# Patient Record
Sex: Male | Born: 1955 | Race: White | Marital: Single
Health system: Southern US, Community
[De-identification: ages and names within clinical notes are randomized; demographics above are authoritative.]

---

## 2011-12-11 ENCOUNTER — Other Ambulatory Visit: Payer: Self-pay | Admitting: Neurosurgery

## 2011-12-11 DIAGNOSIS — M48061 Spinal stenosis, lumbar region without neurogenic claudication: Secondary | ICD-10-CM

## 2011-12-26 ENCOUNTER — Ambulatory Visit
Admission: RE | Admit: 2011-12-26 | Discharge: 2011-12-26 | Disposition: A | Discharge status: Home / Self Care | Payer: BC Managed Care – PPO | Source: Ambulatory Visit | Attending: Neurosurgery | Admitting: Neurosurgery

## 2011-12-26 ENCOUNTER — Ambulatory Visit
Admission: RE | Admit: 2011-12-26 | Discharge: 2011-12-26 | Disposition: A | Discharge status: Home / Self Care | Payer: Medicare PPO | Source: Ambulatory Visit | Attending: Neurosurgery | Admitting: Neurosurgery

## 2011-12-26 VITALS — BP 141/79 | HR 69

## 2011-12-26 DIAGNOSIS — M48061 Spinal stenosis, lumbar region without neurogenic claudication: Secondary | ICD-10-CM

## 2011-12-26 MED ORDER — OXYCODONE-ACETAMINOPHEN 5-325 MG PO TABS
2.0000 | ORAL_TABLET | Freq: Once | ORAL | Status: AC
  Administered 2011-12-26: 2 via ORAL

## 2011-12-26 MED ORDER — IOHEXOL 180 MG/ML  SOLN
15.0000 mL | Freq: Once | INTRAMUSCULAR | Status: AC | PRN
  Administered 2011-12-26: 15 mL via INTRATHECAL

## 2011-12-26 MED ORDER — DIAZEPAM 5 MG PO TABS
10.0000 mg | ORAL_TABLET | Freq: Once | ORAL | Status: AC
  Administered 2011-12-26: 10 mg via ORAL

## 2011-12-26 NOTE — Progress Notes (Signed)
Patient states he has been off Trazadone for the past two days.  Larina Earthly, RN

## 2013-04-26 IMAGING — CT CT L SPINE W/ CM
3 of 9 series · 10 of 27 positions shown, 11 images · IV contrast (omnipaque)
Comparison: Lumbar MRI 10/20/2011 at [facility].

CLINICAL DATA: Low back pain extending into the right lower
extremity.  Pain extends along the posterior right thigh to the
knee.

MYELOGRAM INJECTION
TECHNIQUE: Informed consent was obtained from the patient prior to
the procedure, including potential complications of headache,
allergy, infection and pain.  A timeout procedure was performed.
With the patient prone, the lower back was prepped with Betadine.
1% Lidocaine was used for local anesthesia.  Lumbar puncture was
performed at the left paramidline L1-2 level using a 22 gauge
needle with return of clear CSF.  15 ml of Omnipaque 775was
injected into the subarachnoid space .
TECHNIQUE: I personally performed the lumbar puncture and
administered the intrathecal contrast. I also personally supervised
acquisition of the myelogram images. Following injection of
intrathecal Omnipaque contrast, spine imaging in multiple
projections was performed using fluoroscopy.
Fluoroscopy Time: 1.2 minutes.
TECHNIQUE: CT imaging of the lumbar spine was performed after
intrathecal contrast administration.  Multiplanar CT image
reconstructions were also generated.

[Series 2: l spine bone · axial · 0.27mm/px · z∈[-65,+3]mm · 2 of 82 slices shown, 3 images]
[im 28/82  soft-tissue]
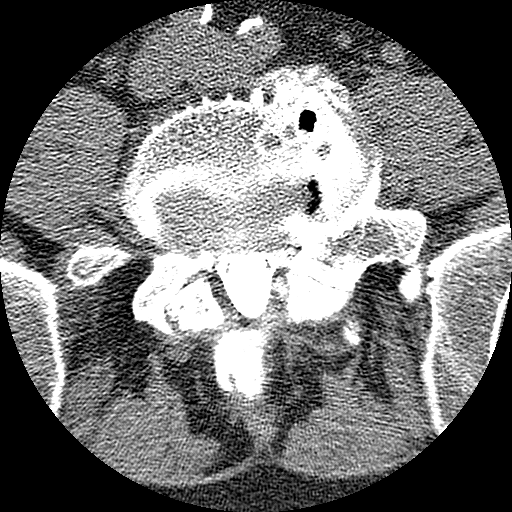
[im 28/82  bone]
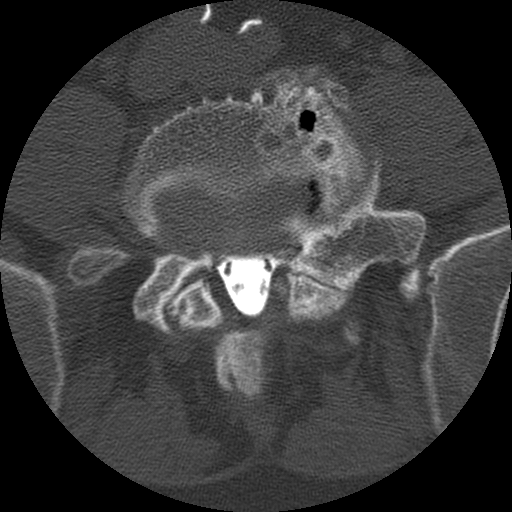
[im 55/82  bone]
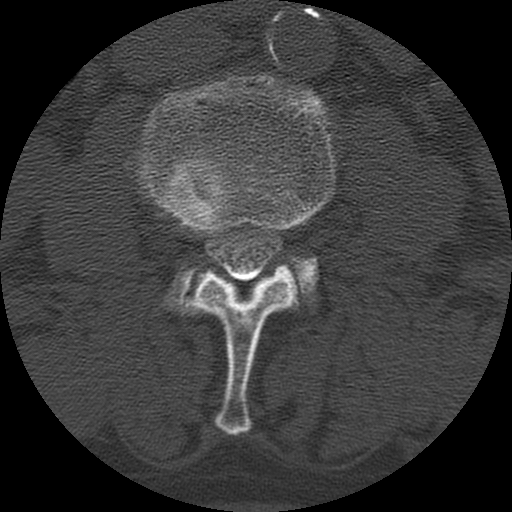

[Series 3: l spine soft · axial · 0.27mm/px · z∈[-82,+18]mm · 3 of 82 slices shown]
[im 21/82  soft-tissue]
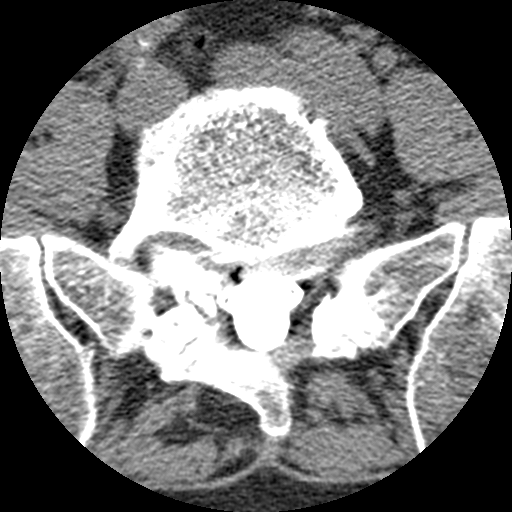
[im 41/82  soft-tissue]
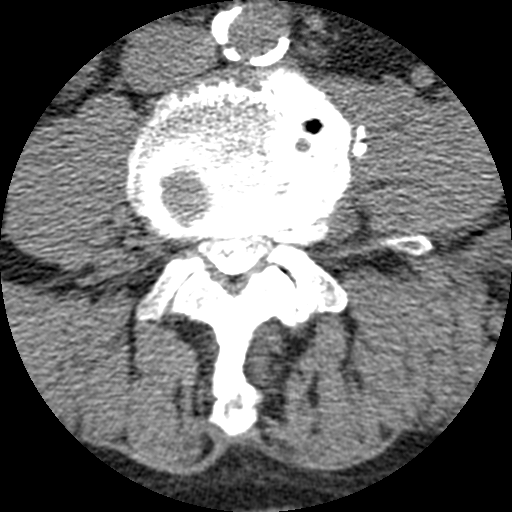
[im 61/82  soft-tissue]
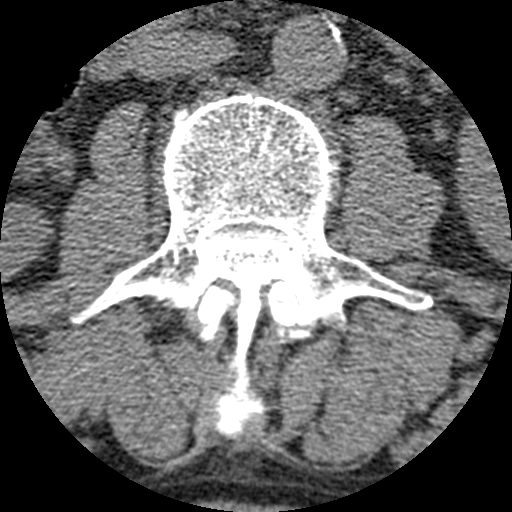

[Series 400: cor · coronal · 0.41mm/px · 5 of 50 slices shown]
[im 9/50  bone]
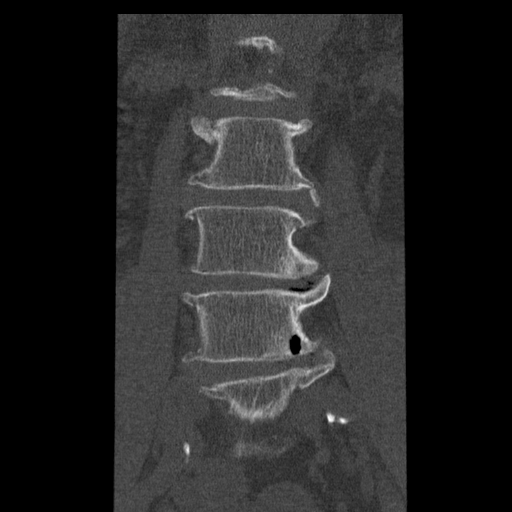
[im 17/50  bone]
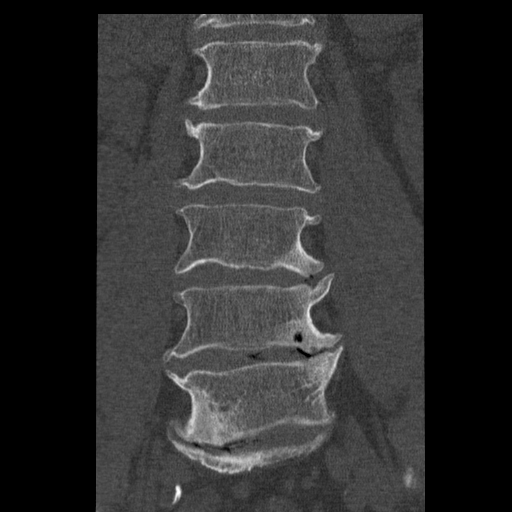
[im 25/50  bone]
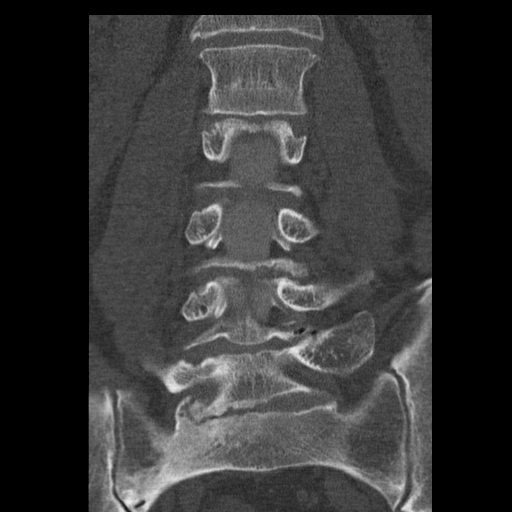
[im 33/50  bone]
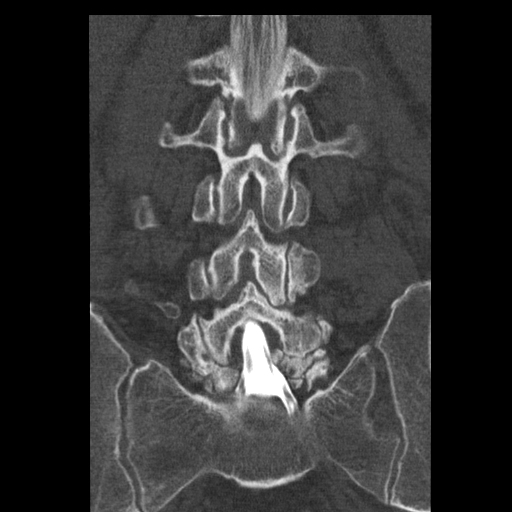
[im 41/50  bone]
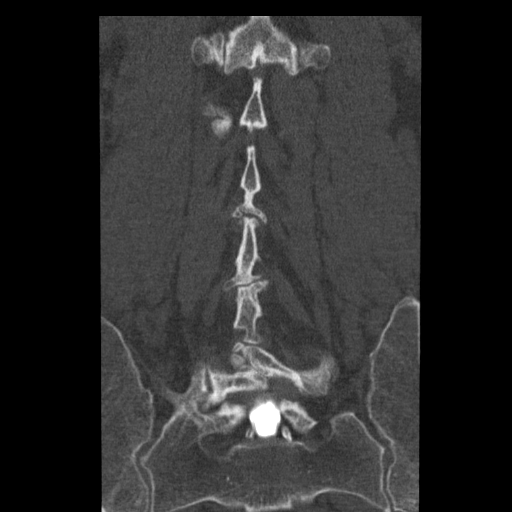

[10 of 27 positions shown; findings below may reference images not displayed]

IMPRESSION: Successful injection of  intrathecal contrast for myelography.

MYELOGRAM LUMBAR
FINDINGS: Five non-rib bearing lumbar type vertebral bodies are
present.  There is significant loss of disc height at L4-5 with
medial deviation of the traversing nerve roots bilaterally.  The L5
nerve root is truncated on the left.

Anterolisthesis is present at L5-S1.  There is slight
retrolisthesis at L4-5

A broad-based disc herniation slight retrolisthesis at L3-4 results
in left greater than right lateral recess narrowing.

A mild broad-based disc herniation at L2-3 also results in left
greater than right lateral recess narrowing.  Mild central canal
narrowing is present at L1-2 secondary to a broad-based disc
herniation.

The upright images demonstrate no significant change in the
appearance of the disc herniations or alignment.
IMPRESSION: 1.  Anterolisthesis at L5-S1 and retrolisthesis at L4-5 compatible
with bilateral pars defects.  There is no significant motion with
flexion or extension.
2. Disc herniation and loss of height at L4-5 with left greater
than right lateral recess narrowing.
3.  Broad-based disc herniation is slight retrolisthesis at L3-4
with left greater than right lateral recess narrowing.
4. L2-3 disc herniation with left greater than right lateral recess
narrowing.
5.  Mild central canal narrowing at L1-2.


CT MYELOGRAPHY LUMBAR SPINE
FINDINGS: The lumbar spine is imaged from the midbody of T12-S2.
Atherosclerotic calcifications are present within the abdominal
aorta and proximal iliac arteries.  Limited imaging of the abdomen
is otherwise unremarkable.  Degenerative changes are noted in the
SI joints bilaterally.  The

There is mild rightward curvature of the lumbar spine centered at
L3-4.  Asymmetric left-sided endplate changes are present at L3-4
and L4-5.  There is compensatory leftward curvature at L5-S1 with
right-sided osteophyte formation.

T12-L1:  Minimal disc bulging is present.  There is no significant
stenosis.

L1-2:  A calcified broad-based disc herniation partially effaces
the ventral CSF.  Mild central canal narrowing is present.  The
foramina are patent.

L2-3:  A broad-based disc herniation is present.  Mild lateral
recess narrowing is worse on the left.  Mild foraminal narrowing is
worse on the right.

L5-S1:  A broad-based disc herniation is present.  Moderate facet
hypertrophy is present bilaterally as well.  There is a calcified
disc extending into the left neural foramen.  This results in mild
left lateral recess and foraminal stenosis.

L4-5:  A broad-based disc herniation is present.  There is slight
retrolisthesis.  Facet hypertrophy and spurring results in mild to
moderate lateral recess narrowing bilaterally.  Moderate left and
mild right foraminal stenosis is secondary to facet spurring.

L5-S1:  The patient is status post laminotomies.  Bilateral pars
defects are present.  The central canals patent.  Moderate right
foraminal stenosis is secondary to facet spurring.
IMPRESSION: 1.  No significant right-sided disease is at the right L5-S1
foramen, potentially affecting the right L5 nerve root.
2.  Mild rightward curvature of the lumbar spine at L3-4 and L4-5
with left greater than right lateral recess and foraminal stenosis
at these levels.
3.  A mild broad-based disc herniation at L2-3 results in mild left
lateral recess narrowing.
4.  Multilevel facet degenerative changes.

## 2017-08-13 DIAGNOSIS — R197 Diarrhea, unspecified: Secondary | ICD-10-CM | POA: Diagnosis not present

## 2017-08-13 DIAGNOSIS — R12 Heartburn: Secondary | ICD-10-CM | POA: Diagnosis not present

## 2017-08-19 DIAGNOSIS — R197 Diarrhea, unspecified: Secondary | ICD-10-CM | POA: Diagnosis not present

## 2017-10-05 DIAGNOSIS — R197 Diarrhea, unspecified: Secondary | ICD-10-CM | POA: Diagnosis not present

## 2017-10-05 DIAGNOSIS — K2210 Ulcer of esophagus without bleeding: Secondary | ICD-10-CM | POA: Diagnosis not present

## 2017-10-05 DIAGNOSIS — K219 Gastro-esophageal reflux disease without esophagitis: Secondary | ICD-10-CM | POA: Diagnosis not present

## 2017-10-05 DIAGNOSIS — K5730 Diverticulosis of large intestine without perforation or abscess without bleeding: Secondary | ICD-10-CM | POA: Diagnosis not present

## 2017-10-05 DIAGNOSIS — K210 Gastro-esophageal reflux disease with esophagitis: Secondary | ICD-10-CM | POA: Diagnosis not present

## 2017-10-05 DIAGNOSIS — R12 Heartburn: Secondary | ICD-10-CM | POA: Diagnosis not present

## 2017-10-05 DIAGNOSIS — Z86010 Personal history of colonic polyps: Secondary | ICD-10-CM | POA: Diagnosis not present

## 2017-10-12 DIAGNOSIS — I10 Essential (primary) hypertension: Secondary | ICD-10-CM | POA: Diagnosis not present

## 2017-11-05 DIAGNOSIS — K580 Irritable bowel syndrome with diarrhea: Secondary | ICD-10-CM | POA: Diagnosis not present

## 2017-11-05 DIAGNOSIS — K210 Gastro-esophageal reflux disease with esophagitis: Secondary | ICD-10-CM | POA: Diagnosis not present

## 2017-11-18 DIAGNOSIS — Z79899 Other long term (current) drug therapy: Secondary | ICD-10-CM | POA: Diagnosis not present

## 2017-11-18 DIAGNOSIS — E785 Hyperlipidemia, unspecified: Secondary | ICD-10-CM | POA: Diagnosis not present

## 2017-11-18 DIAGNOSIS — I10 Essential (primary) hypertension: Secondary | ICD-10-CM | POA: Diagnosis not present

## 2017-11-18 DIAGNOSIS — Z125 Encounter for screening for malignant neoplasm of prostate: Secondary | ICD-10-CM | POA: Diagnosis not present

## 2017-11-18 DIAGNOSIS — M25511 Pain in right shoulder: Secondary | ICD-10-CM | POA: Diagnosis not present

## 2017-11-23 DIAGNOSIS — M5136 Other intervertebral disc degeneration, lumbar region: Secondary | ICD-10-CM | POA: Diagnosis not present

## 2017-11-23 DIAGNOSIS — M48062 Spinal stenosis, lumbar region with neurogenic claudication: Secondary | ICD-10-CM | POA: Diagnosis not present

## 2017-12-08 DIAGNOSIS — R351 Nocturia: Secondary | ICD-10-CM | POA: Diagnosis not present

## 2017-12-08 DIAGNOSIS — N401 Enlarged prostate with lower urinary tract symptoms: Secondary | ICD-10-CM | POA: Diagnosis not present

## 2017-12-23 DIAGNOSIS — M5416 Radiculopathy, lumbar region: Secondary | ICD-10-CM | POA: Diagnosis not present

## 2017-12-23 DIAGNOSIS — M48062 Spinal stenosis, lumbar region with neurogenic claudication: Secondary | ICD-10-CM | POA: Diagnosis not present

## 2017-12-25 DIAGNOSIS — R748 Abnormal levels of other serum enzymes: Secondary | ICD-10-CM | POA: Diagnosis not present

## 2017-12-30 DIAGNOSIS — M48062 Spinal stenosis, lumbar region with neurogenic claudication: Secondary | ICD-10-CM | POA: Diagnosis not present

## 2017-12-30 DIAGNOSIS — M545 Low back pain: Secondary | ICD-10-CM | POA: Diagnosis not present

## 2017-12-31 DIAGNOSIS — M5416 Radiculopathy, lumbar region: Secondary | ICD-10-CM | POA: Diagnosis not present

## 2017-12-31 DIAGNOSIS — M48062 Spinal stenosis, lumbar region with neurogenic claudication: Secondary | ICD-10-CM | POA: Diagnosis not present

## 2017-12-31 DIAGNOSIS — M5136 Other intervertebral disc degeneration, lumbar region: Secondary | ICD-10-CM | POA: Diagnosis not present

## 2018-01-01 DIAGNOSIS — Z6834 Body mass index (BMI) 34.0-34.9, adult: Secondary | ICD-10-CM | POA: Diagnosis not present

## 2018-01-01 DIAGNOSIS — Z8619 Personal history of other infectious and parasitic diseases: Secondary | ICD-10-CM | POA: Diagnosis not present

## 2018-01-01 DIAGNOSIS — I10 Essential (primary) hypertension: Secondary | ICD-10-CM | POA: Diagnosis not present

## 2018-01-14 DIAGNOSIS — M5136 Other intervertebral disc degeneration, lumbar region: Secondary | ICD-10-CM | POA: Diagnosis not present

## 2018-01-14 DIAGNOSIS — M47817 Spondylosis without myelopathy or radiculopathy, lumbosacral region: Secondary | ICD-10-CM | POA: Diagnosis not present

## 2018-01-28 DIAGNOSIS — M5416 Radiculopathy, lumbar region: Secondary | ICD-10-CM | POA: Diagnosis not present

## 2018-02-08 DIAGNOSIS — M5136 Other intervertebral disc degeneration, lumbar region: Secondary | ICD-10-CM | POA: Diagnosis not present

## 2018-02-08 DIAGNOSIS — M47817 Spondylosis without myelopathy or radiculopathy, lumbosacral region: Secondary | ICD-10-CM | POA: Diagnosis not present

## 2018-02-17 DIAGNOSIS — M5136 Other intervertebral disc degeneration, lumbar region: Secondary | ICD-10-CM | POA: Diagnosis not present

## 2018-02-25 DIAGNOSIS — M5136 Other intervertebral disc degeneration, lumbar region: Secondary | ICD-10-CM | POA: Diagnosis not present

## 2018-02-25 DIAGNOSIS — M47817 Spondylosis without myelopathy or radiculopathy, lumbosacral region: Secondary | ICD-10-CM | POA: Diagnosis not present

## 2018-03-09 DIAGNOSIS — E785 Hyperlipidemia, unspecified: Secondary | ICD-10-CM | POA: Diagnosis not present

## 2018-03-09 DIAGNOSIS — R972 Elevated prostate specific antigen [PSA]: Secondary | ICD-10-CM | POA: Diagnosis not present

## 2018-03-09 DIAGNOSIS — I10 Essential (primary) hypertension: Secondary | ICD-10-CM | POA: Diagnosis not present

## 2018-03-09 DIAGNOSIS — M545 Low back pain: Secondary | ICD-10-CM | POA: Diagnosis not present

## 2018-03-17 DIAGNOSIS — M5136 Other intervertebral disc degeneration, lumbar region: Secondary | ICD-10-CM | POA: Diagnosis not present

## 2018-03-17 DIAGNOSIS — M5416 Radiculopathy, lumbar region: Secondary | ICD-10-CM | POA: Diagnosis not present

## 2018-04-06 DIAGNOSIS — R252 Cramp and spasm: Secondary | ICD-10-CM | POA: Diagnosis not present

## 2018-04-06 DIAGNOSIS — Z79899 Other long term (current) drug therapy: Secondary | ICD-10-CM | POA: Diagnosis not present

## 2018-04-06 DIAGNOSIS — M545 Low back pain: Secondary | ICD-10-CM | POA: Diagnosis not present

## 2018-04-14 DIAGNOSIS — M25552 Pain in left hip: Secondary | ICD-10-CM | POA: Diagnosis not present

## 2018-04-14 DIAGNOSIS — M792 Neuralgia and neuritis, unspecified: Secondary | ICD-10-CM | POA: Diagnosis not present

## 2018-04-14 DIAGNOSIS — M47816 Spondylosis without myelopathy or radiculopathy, lumbar region: Secondary | ICD-10-CM | POA: Diagnosis not present

## 2018-04-22 DIAGNOSIS — M4716 Other spondylosis with myelopathy, lumbar region: Secondary | ICD-10-CM | POA: Diagnosis not present

## 2018-04-22 DIAGNOSIS — G8929 Other chronic pain: Secondary | ICD-10-CM | POA: Diagnosis not present

## 2018-05-10 DIAGNOSIS — G8929 Other chronic pain: Secondary | ICD-10-CM | POA: Insufficient documentation

## 2018-05-28 DIAGNOSIS — G8929 Other chronic pain: Secondary | ICD-10-CM | POA: Diagnosis not present

## 2018-05-28 DIAGNOSIS — M47816 Spondylosis without myelopathy or radiculopathy, lumbar region: Secondary | ICD-10-CM | POA: Diagnosis not present

## 2018-05-28 DIAGNOSIS — M25552 Pain in left hip: Secondary | ICD-10-CM | POA: Diagnosis not present

## 2018-06-04 DIAGNOSIS — M47816 Spondylosis without myelopathy or radiculopathy, lumbar region: Secondary | ICD-10-CM | POA: Diagnosis not present

## 2018-06-08 DIAGNOSIS — I10 Essential (primary) hypertension: Secondary | ICD-10-CM | POA: Diagnosis not present

## 2018-06-08 DIAGNOSIS — E785 Hyperlipidemia, unspecified: Secondary | ICD-10-CM | POA: Diagnosis not present

## 2021-05-13 DIAGNOSIS — G4700 Insomnia, unspecified: Secondary | ICD-10-CM | POA: Diagnosis not present

## 2021-05-13 DIAGNOSIS — M545 Low back pain, unspecified: Secondary | ICD-10-CM | POA: Diagnosis not present

## 2021-05-13 DIAGNOSIS — Z8719 Personal history of other diseases of the digestive system: Secondary | ICD-10-CM | POA: Diagnosis not present

## 2021-05-13 DIAGNOSIS — D693 Immune thrombocytopenic purpura: Secondary | ICD-10-CM | POA: Diagnosis not present

## 2021-05-13 DIAGNOSIS — G8929 Other chronic pain: Secondary | ICD-10-CM | POA: Diagnosis not present

## 2021-05-13 DIAGNOSIS — I10 Essential (primary) hypertension: Secondary | ICD-10-CM | POA: Diagnosis not present

## 2021-05-13 DIAGNOSIS — E785 Hyperlipidemia, unspecified: Secondary | ICD-10-CM | POA: Diagnosis not present

## 2021-05-13 DIAGNOSIS — Z6830 Body mass index (BMI) 30.0-30.9, adult: Secondary | ICD-10-CM | POA: Diagnosis not present

## 2021-05-13 DIAGNOSIS — R69 Illness, unspecified: Secondary | ICD-10-CM | POA: Diagnosis not present

## 2021-07-17 DIAGNOSIS — I25119 Atherosclerotic heart disease of native coronary artery with unspecified angina pectoris: Secondary | ICD-10-CM | POA: Diagnosis not present

## 2021-07-17 DIAGNOSIS — Z809 Family history of malignant neoplasm, unspecified: Secondary | ICD-10-CM | POA: Diagnosis not present

## 2021-07-17 DIAGNOSIS — I951 Orthostatic hypotension: Secondary | ICD-10-CM | POA: Diagnosis not present

## 2021-07-17 DIAGNOSIS — G629 Polyneuropathy, unspecified: Secondary | ICD-10-CM | POA: Diagnosis not present

## 2021-07-17 DIAGNOSIS — M1990 Unspecified osteoarthritis, unspecified site: Secondary | ICD-10-CM | POA: Diagnosis not present

## 2021-07-17 DIAGNOSIS — Z8249 Family history of ischemic heart disease and other diseases of the circulatory system: Secondary | ICD-10-CM | POA: Diagnosis not present

## 2021-07-17 DIAGNOSIS — Z791 Long term (current) use of non-steroidal anti-inflammatories (NSAID): Secondary | ICD-10-CM | POA: Diagnosis not present

## 2021-07-17 DIAGNOSIS — Z6830 Body mass index (BMI) 30.0-30.9, adult: Secondary | ICD-10-CM | POA: Diagnosis not present

## 2021-07-17 DIAGNOSIS — E669 Obesity, unspecified: Secondary | ICD-10-CM | POA: Diagnosis not present

## 2021-07-17 DIAGNOSIS — R69 Illness, unspecified: Secondary | ICD-10-CM | POA: Diagnosis not present

## 2021-07-17 DIAGNOSIS — I10 Essential (primary) hypertension: Secondary | ICD-10-CM | POA: Diagnosis not present

## 2021-11-19 DIAGNOSIS — I10 Essential (primary) hypertension: Secondary | ICD-10-CM | POA: Diagnosis not present

## 2021-11-19 DIAGNOSIS — G8929 Other chronic pain: Secondary | ICD-10-CM | POA: Diagnosis not present

## 2021-11-19 DIAGNOSIS — E785 Hyperlipidemia, unspecified: Secondary | ICD-10-CM | POA: Diagnosis not present

## 2021-11-19 DIAGNOSIS — G4700 Insomnia, unspecified: Secondary | ICD-10-CM | POA: Diagnosis not present

## 2021-11-19 DIAGNOSIS — R69 Illness, unspecified: Secondary | ICD-10-CM | POA: Diagnosis not present

## 2021-11-19 DIAGNOSIS — D693 Immune thrombocytopenic purpura: Secondary | ICD-10-CM | POA: Diagnosis not present

## 2022-06-26 ENCOUNTER — Telehealth: Payer: Self-pay | Admitting: *Deleted

## 2022-06-26 NOTE — Telephone Encounter (Signed)
        Patient  visited [facility] on 06/19/2022  for treatment    Telephone encounter attempt :  1st  A HIPAA compliant voice message was left requesting a return call.  Instructed patient to call back at [phone].  [place] [phone] [address] , [place] [address] Email : [email or web address]

## 2022-06-27 ENCOUNTER — Telehealth: Payer: Self-pay | Admitting: *Deleted

## 2022-06-27 NOTE — Telephone Encounter (Signed)
       Patient  visited [facility] on 06/19/2022  for treatment     Telephone encounter attempt :  2nd  A HIPAA compliant voice message was left requesting a return call.  Instructed patient to call back at [phone].  [facility] [phone] [address] , [place] [address] Email : [email or web address]

## 2022-09-22 DIAGNOSIS — Z01818 Encounter for other preprocedural examination: Secondary | ICD-10-CM

## 2022-10-01 ENCOUNTER — Encounter: Payer: Self-pay | Admitting: Orthopedic Surgery

## 2022-12-18 ENCOUNTER — Emergency Department (HOSPITAL_COMMUNITY)
Admission: EM | Admit: 2022-12-18 | Discharge: 2022-12-19 | Disposition: A | Discharge status: Home / Self Care | Payer: No Typology Code available for payment source | Attending: Emergency Medicine | Admitting: Emergency Medicine

## 2022-12-18 ENCOUNTER — Emergency Department (HOSPITAL_COMMUNITY): Payer: No Typology Code available for payment source

## 2022-12-18 ENCOUNTER — Other Ambulatory Visit: Payer: Self-pay

## 2022-12-18 DIAGNOSIS — S2241XA Multiple fractures of ribs, right side, initial encounter for closed fracture: Secondary | ICD-10-CM | POA: Diagnosis not present

## 2022-12-18 DIAGNOSIS — S41102A Unspecified open wound of left upper arm, initial encounter: Secondary | ICD-10-CM | POA: Insufficient documentation

## 2022-12-18 DIAGNOSIS — Z23 Encounter for immunization: Secondary | ICD-10-CM | POA: Diagnosis not present

## 2022-12-18 DIAGNOSIS — S21219A Laceration without foreign body of unspecified back wall of thorax without penetration into thoracic cavity, initial encounter: Secondary | ICD-10-CM

## 2022-12-18 DIAGNOSIS — S22039A Unspecified fracture of third thoracic vertebra, initial encounter for closed fracture: Secondary | ICD-10-CM | POA: Insufficient documentation

## 2022-12-18 DIAGNOSIS — S21212A Laceration without foreign body of left back wall of thorax without penetration into thoracic cavity, initial encounter: Secondary | ICD-10-CM | POA: Diagnosis not present

## 2022-12-18 DIAGNOSIS — Y907 Blood alcohol level of 200-239 mg/100 ml: Secondary | ICD-10-CM | POA: Insufficient documentation

## 2022-12-18 DIAGNOSIS — S2249XA Multiple fractures of ribs, unspecified side, initial encounter for closed fracture: Secondary | ICD-10-CM

## 2022-12-18 DIAGNOSIS — T148XXA Other injury of unspecified body region, initial encounter: Secondary | ICD-10-CM

## 2022-12-18 DIAGNOSIS — S299XXA Unspecified injury of thorax, initial encounter: Secondary | ICD-10-CM | POA: Diagnosis present

## 2022-12-18 DIAGNOSIS — S41112A Laceration without foreign body of left upper arm, initial encounter: Secondary | ICD-10-CM

## 2022-12-18 DIAGNOSIS — S22049A Unspecified fracture of fourth thoracic vertebra, initial encounter for closed fracture: Secondary | ICD-10-CM | POA: Diagnosis not present

## 2022-12-18 DIAGNOSIS — S22009A Unspecified fracture of unspecified thoracic vertebra, initial encounter for closed fracture: Secondary | ICD-10-CM

## 2022-12-18 LAB — CBC
HCT: 36.7 % — ABNORMAL LOW (ref 39.0–52.0)
Hemoglobin: 12.3 g/dL — ABNORMAL LOW (ref 13.0–17.0)
MCH: 30.5 pg (ref 26.0–34.0)
MCHC: 33.5 g/dL (ref 30.0–36.0)
MCV: 91.1 fL (ref 80.0–100.0)
Platelets: 140 10*3/uL — ABNORMAL LOW (ref 150–400)
RBC: 4.03 MIL/uL — ABNORMAL LOW (ref 4.22–5.81)
RDW: 12.5 % (ref 11.5–15.5)
WBC: 6.1 10*3/uL (ref 4.0–10.5)
nRBC: 0 % (ref 0.0–0.2)

## 2022-12-18 LAB — COMPREHENSIVE METABOLIC PANEL
ALT: 18 U/L (ref 0–44)
AST: 21 U/L (ref 15–41)
Albumin: 3.4 g/dL — ABNORMAL LOW (ref 3.5–5.0)
Alkaline Phosphatase: 67 U/L (ref 38–126)
Anion gap: 12 (ref 5–15)
BUN: 11 mg/dL (ref 8–23)
CO2: 22 mmol/L (ref 22–32)
Calcium: 8.3 mg/dL — ABNORMAL LOW (ref 8.9–10.3)
Chloride: 107 mmol/L (ref 98–111)
Creatinine, Ser: 0.89 mg/dL (ref 0.61–1.24)
GFR, Estimated: >60 mL/min (ref >60)
Glucose, Bld: 122 mg/dL — ABNORMAL HIGH (ref 70–99)
Potassium: 3.5 mmol/L (ref 3.5–5.1)
Sodium: 141 mmol/L (ref 135–145)
Total Bilirubin: 0.5 mg/dL (ref 0.3–1.2)
Total Protein: 5.6 g/dL — ABNORMAL LOW (ref 6.5–8.1)

## 2022-12-18 LAB — PROTIME-INR
INR: 1.1 (ref 0.8–1.2)
Prothrombin Time: 14.2 s (ref 11.4–15.2)

## 2022-12-18 LAB — I-STAT CHEM 8, ED
BUN: 12 mg/dL (ref 8–23)
Calcium, Ion: 1.05 mmol/L — ABNORMAL LOW (ref 1.15–1.40)
Chloride: 106 mmol/L (ref 98–111)
Creatinine, Ser: 1.1 mg/dL (ref 0.61–1.24)
Glucose, Bld: 122 mg/dL — ABNORMAL HIGH (ref 70–99)
HCT: 36 % — ABNORMAL LOW (ref 39.0–52.0)
Hemoglobin: 12.2 g/dL — ABNORMAL LOW (ref 13.0–17.0)
Potassium: 3.4 mmol/L — ABNORMAL LOW (ref 3.5–5.1)
Sodium: 143 mmol/L (ref 135–145)
TCO2: 22 mmol/L (ref 22–32)

## 2022-12-18 LAB — ETHANOL: Alcohol, Ethyl (B): 206 mg/dL — ABNORMAL HIGH (ref <10)

## 2022-12-18 LAB — SAMPLE TO BLOOD BANK

## 2022-12-18 LAB — I-STAT CG4 LACTIC ACID, ED: Lactic Acid, Venous: 2.8 mmol/L (ref 0.5–1.9)

## 2022-12-18 MED ORDER — HYDROMORPHONE HCL 1 MG/ML IJ SOLN
1.0000 mg | Freq: Once | INTRAMUSCULAR | Status: AC
  Administered 2022-12-18: 1 mg via INTRAVENOUS

## 2022-12-18 MED ORDER — IOHEXOL 350 MG/ML SOLN
125.0000 mL | Freq: Once | INTRAVENOUS | Status: AC | PRN
  Administered 2022-12-18: 125 mL via INTRAVENOUS

## 2022-12-18 MED ORDER — SODIUM CHLORIDE 0.9 % IV BOLUS
1000.0000 mL | Freq: Once | INTRAVENOUS | Status: AC
  Administered 2022-12-18: 1000 mL via INTRAVENOUS

## 2022-12-18 MED ORDER — CEPHALEXIN 250 MG PO CAPS
500.0000 mg | ORAL_CAPSULE | Freq: Once | ORAL | Status: AC
  Administered 2022-12-18: 500 mg via ORAL
  Filled 2022-12-18: qty 2

## 2022-12-18 MED ORDER — HYDROMORPHONE HCL 1 MG/ML IJ SOLN
INTRAMUSCULAR | Status: AC
  Filled 2022-12-18: qty 1

## 2022-12-18 MED ORDER — TETANUS-DIPHTH-ACELL PERTUSSIS 5-2.5-18.5 LF-MCG/0.5 IM SUSY
0.5000 mL | PREFILLED_SYRINGE | Freq: Once | INTRAMUSCULAR | Status: AC
  Administered 2022-12-18: 0.5 mL via INTRAMUSCULAR

## 2022-12-18 MED ORDER — LIDOCAINE-EPINEPHRINE (PF) 2 %-1:200000 IJ SOLN
INTRAMUSCULAR | Status: AC
  Filled 2022-12-18: qty 20

## 2022-12-18 MED ORDER — CEPHALEXIN 500 MG PO CAPS: 500.0000 mg | ORAL_CAPSULE | Freq: Two times a day (BID) | ORAL | 0 refills | Status: DC
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
## 2022-12-18 NOTE — Procedures (Signed)
Laceration Repair  Date/Time: 12/18/2022 10:58 PM  Performed by: Violeta Gelinas, MD Authorized by: Violeta Gelinas, MD   Consent:    Consent obtained:  Emergent situation Universal protocol:    Relevant documents present and verified: yes     Imaging studies available: yes     Immediately prior to procedure, a time out was called: yes     Patient identity confirmed:  Arm band Anesthesia:    Anesthesia method:  Local infiltration Exploration:    Hemostasis achieved with:  Tied off vessels Treatment:    Area cleansed with:  Povidone-iodine   Irrigation solution:  Sterile saline Skin repair:    Repair method:  Staples Back 5cm LUE biceps 4cm  LUE elbow 4cm  Violeta Gelinas, MD, MPH, FACS Please use [email or web address] to contact on call provider

## 2022-12-18 NOTE — Consult Note (Signed)
Reason for Consult:SW back, SW L arm X 2 Referring Physician: Barnes Daniels is an 67 y.o. male.  HPI: Carl Daniels came in as a level 1 trauma S/P SW back and SW LUE x 2. GCS 15. Intoxicated. Tourniquet LUE. Not really cooperative with history. Active bleeding from vein L bicep area SW sutured in trauma bay.  No past medical history on file.  No family history on file.  Social History:  has no history on file for tobacco use, alcohol use, and drug use.  Allergies: Not on File  Medications: I have reviewed the patient's current medications.  Results for orders placed or performed during the hospital encounter of 12/18/22 (from the past 48 hour(s))  Sample to Blood Bank     Status: None   Collection Time: 12/18/22  9:49 PM  Result Value Ref Range   Blood Bank Specimen SAMPLE AVAILABLE FOR TESTING    Sample Expiration      12/21/2022,2359 Performed at [facility], [address], [place], [place] [address]   I-Stat Chem 8, ED     Status: Abnormal   Collection Time: 12/18/22  9:59 PM  Result Value Ref Range   Sodium 143 135 - 145 mmol/L   Potassium 3.4 (L) 3.5 - 5.1 mmol/L   Chloride 106 98 - 111 mmol/L   BUN 12 8 - 23 mg/dL   Creatinine, Ser 6.04 0.61 - 1.24 mg/dL   Glucose, Bld 540 (H) 70 - 99 mg/dL    Comment: Glucose reference range applies only to samples taken after fasting for at least 8 hours.   Calcium, Ion 1.05 (L) 1.15 - 1.40 mmol/L   TCO2 22 22 - 32 mmol/L   Hemoglobin 12.2 (L) 13.0 - 17.0 g/dL   HCT 98.1 (L) 19.1 - 47.8 %  I-Stat Lactic Acid, ED     Status: Abnormal   Collection Time: 12/18/22 10:00 PM  Result Value Ref Range   Lactic Acid, Venous 2.8 (HH) 0.5 - 1.9 mmol/L   Comment NOTIFIED PHYSICIAN     No results found.  Review of Systems  Unable to perform ROS: Other   Blood pressure 108/70, pulse 80, temperature (!) 97 F (36.1 C), temperature source Tympanic, resp. rate 17, SpO2 94%. Physical Exam Constitutional:      General:  He is in acute distress.  HENT:     Head: Normocephalic.  Eyes:     Pupils: Pupils are equal, round, and reactive to light.  Cardiovascular:     Rate and Rhythm: Normal rate and regular rhythm.     Pulses: Normal pulses.  Pulmonary:     Effort: Pulmonary effort is normal.     Breath sounds: Normal breath sounds.  Abdominal:     General: Abdomen is flat.     Palpations: Abdomen is soft.     Tenderness: There is no abdominal tenderness. There is no guarding.  Musculoskeletal:     Cervical back: No tenderness.     Comments: 5cm SW L mid back tracks over R scapula 3cm L lateral elbow lac, 4cm irregular L biceps area lac     Assessment/Plan: SW back and SW LUE X 2  CT chest no PTX. CT angio LUE no vascular injury. Tetanus updated. Wounds irrigated and closed. OK to D/C Staple removal in 2 weeks  Carl Daniels 12/18/2022, 10:05 PM

## 2022-12-18 NOTE — Progress Notes (Signed)
Orthopedic Tech Progress Note Patient Details:  Carl Daniels 1955/05/16 [id number]  Level 1 trauma   Patient ID: Carl Daniels, male   DOB: 10-09-1955, 67 y.o.   MRN: [id number]  Carl Daniels 12/18/2022, 10:15 PM

## 2022-12-19 MED ORDER — OXYCODONE-ACETAMINOPHEN 5-325 MG PO TABS
2.0000 | ORAL_TABLET | ORAL | 0 refills | Status: DC | PRN
Start: 2022-12-19 — End: 2023-06-05

## 2022-12-19 MED ORDER — KETOROLAC TROMETHAMINE 30 MG/ML IJ SOLN
15.0000 mg | Freq: Once | INTRAMUSCULAR | Status: AC
  Administered 2022-12-19: 15 mg via INTRAVENOUS
  Filled 2022-12-19: qty 1

## 2022-12-19 MED ORDER — CEPHALEXIN 500 MG PO CAPS: 500.0000 mg | ORAL_CAPSULE | Freq: Two times a day (BID) | ORAL | 0 refills | Status: AC

## 2022-12-19 MED ORDER — OXYCODONE-ACETAMINOPHEN 5-325 MG PO TABS
2.0000 | ORAL_TABLET | Freq: Once | ORAL | Status: AC
  Administered 2022-12-19: 2 via ORAL
  Filled 2022-12-19: qty 2

## 2023-05-19 ENCOUNTER — Ambulatory Visit (HOSPITAL_BASED_OUTPATIENT_CLINIC_OR_DEPARTMENT_OTHER)
Admission: RE | Admit: 2023-05-19 | Discharge: 2023-05-19 | Disposition: A | Discharge status: Home / Self Care | Payer: No Typology Code available for payment source | Source: Ambulatory Visit

## 2023-05-19 ENCOUNTER — Ambulatory Visit (INDEPENDENT_AMBULATORY_CARE_PROVIDER_SITE_OTHER)
Admit: 2023-05-19 | Discharge: 2023-05-19 | Disposition: A | Discharge status: Home / Self Care | Payer: Self-pay | Attending: Family Medicine | Admitting: Family Medicine

## 2023-05-19 ENCOUNTER — Encounter (HOSPITAL_BASED_OUTPATIENT_CLINIC_OR_DEPARTMENT_OTHER): Payer: Self-pay

## 2023-05-19 VITALS — BP 123/67 | HR 68 | Temp 98.4°F | Resp 20 | Wt 235.0 lb

## 2023-05-19 DIAGNOSIS — Z1211 Encounter for screening for malignant neoplasm of colon: Secondary | ICD-10-CM | POA: Insufficient documentation

## 2023-05-19 DIAGNOSIS — G4700 Insomnia, unspecified: Secondary | ICD-10-CM | POA: Insufficient documentation

## 2023-05-19 DIAGNOSIS — M5416 Radiculopathy, lumbar region: Secondary | ICD-10-CM | POA: Insufficient documentation

## 2023-05-19 DIAGNOSIS — E785 Hyperlipidemia, unspecified: Secondary | ICD-10-CM | POA: Insufficient documentation

## 2023-05-19 DIAGNOSIS — M519 Unspecified thoracic, thoracolumbar and lumbosacral intervertebral disc disorder: Secondary | ICD-10-CM | POA: Insufficient documentation

## 2023-05-19 DIAGNOSIS — S299XXA Unspecified injury of thorax, initial encounter: Secondary | ICD-10-CM

## 2023-05-19 DIAGNOSIS — Z01818 Encounter for other preprocedural examination: Secondary | ICD-10-CM | POA: Insufficient documentation

## 2023-05-19 DIAGNOSIS — N400 Enlarged prostate without lower urinary tract symptoms: Secondary | ICD-10-CM | POA: Insufficient documentation

## 2023-05-19 DIAGNOSIS — Z8619 Personal history of other infectious and parasitic diseases: Secondary | ICD-10-CM | POA: Insufficient documentation

## 2023-05-19 DIAGNOSIS — H9319 Tinnitus, unspecified ear: Secondary | ICD-10-CM | POA: Insufficient documentation

## 2023-05-19 DIAGNOSIS — H2513 Age-related nuclear cataract, bilateral: Secondary | ICD-10-CM | POA: Insufficient documentation

## 2023-05-19 DIAGNOSIS — M5030 Other cervical disc degeneration, unspecified cervical region: Secondary | ICD-10-CM | POA: Insufficient documentation

## 2023-05-19 DIAGNOSIS — I10 Essential (primary) hypertension: Secondary | ICD-10-CM | POA: Insufficient documentation

## 2023-05-19 DIAGNOSIS — K2270 Barrett's esophagus without dysplasia: Secondary | ICD-10-CM | POA: Insufficient documentation

## 2023-05-19 DIAGNOSIS — S2242XS Multiple fractures of ribs, left side, sequela: Secondary | ICD-10-CM | POA: Insufficient documentation

## 2023-05-19 DIAGNOSIS — J449 Chronic obstructive pulmonary disease, unspecified: Secondary | ICD-10-CM | POA: Insufficient documentation

## 2023-05-19 DIAGNOSIS — R0602 Shortness of breath: Secondary | ICD-10-CM | POA: Insufficient documentation

## 2023-05-19 DIAGNOSIS — H905 Unspecified sensorineural hearing loss: Secondary | ICD-10-CM | POA: Insufficient documentation

## 2023-05-19 DIAGNOSIS — K635 Polyp of colon: Secondary | ICD-10-CM | POA: Insufficient documentation

## 2023-05-19 DIAGNOSIS — J441 Chronic obstructive pulmonary disease with (acute) exacerbation: Secondary | ICD-10-CM

## 2023-05-19 DIAGNOSIS — J9811 Atelectasis: Secondary | ICD-10-CM

## 2023-05-19 DIAGNOSIS — R600 Localized edema: Secondary | ICD-10-CM | POA: Insufficient documentation

## 2023-05-19 DIAGNOSIS — M545 Low back pain, unspecified: Secondary | ICD-10-CM | POA: Insufficient documentation

## 2023-05-19 DIAGNOSIS — M160 Bilateral primary osteoarthritis of hip: Secondary | ICD-10-CM | POA: Insufficient documentation

## 2023-05-19 MED ORDER — PREDNISONE 20 MG PO TABS: 20.0000 mg | ORAL_TABLET | Freq: Two times a day (BID) | ORAL | 0 refills | Status: DC

## 2023-05-19 MED ORDER — PROMETHAZINE-DM 6.25-15 MG/5ML PO SYRP: 5.0000 mL | ORAL_SOLUTION | Freq: Three times a day (TID) | ORAL | 0 refills | Status: DC | PRN

## 2023-05-19 MED ORDER — ALBUTEROL SULFATE HFA 108 (90 BASE) MCG/ACT IN AERS
2.0000 | INHALATION_SPRAY | Freq: Once | RESPIRATORY_TRACT | Status: AC
  Administered 2023-05-19: 2 via RESPIRATORY_TRACT

## 2023-05-19 MED ORDER — AMOXICILLIN-POT CLAVULANATE 875-125 MG PO TABS: 1.0000 | ORAL_TABLET | Freq: Two times a day (BID) | ORAL | 0 refills | Status: AC

## 2023-05-19 NOTE — ED Provider Notes (Signed)
[facility]    CSN: [id number] Arrival date & time: 05/19/23  1142      History   Chief Complaint Chief Complaint  Patient presents with   Cough    HPI Carl Daniels is a 68 y.o. male.  Patient with a history of COPD, hypertension, chronic arthritis presents today with several weeks of persistent coughing, shortness of breath and wheezing.  He also reports 2 days ago while walking in the yard he slipped and fell and landed on his chest wall and has had pain with breathing and coughing localized to the right lower chest and is concerned he may have fractured a rib.  Patient has not recently been treated for a COPD exacerbation.  Has [facility] but reports he is having a hard time getting the inhaler to dispense a full dose of the medication.  He reports that when it is working appropriately he achieves improvement of work of breathing.  He has not had fever and denies any known exposure to anyone sick with COVID or flu.  Patient is a daily smoker of marijuana. History reviewed. No pertinent past medical history.  Patient Active Problem List   Diagnosis Date Noted   Age-related nuclear cataract of both eyes 05/19/2023   Chronic obstructive pulmonary disease (HCC) 05/19/2023   DDD (degenerative disc disease), cervical 05/19/2023   Hypertension 05/19/2023   Hyperlipidemia 05/19/2023   Age-related nuclear cataract, bilateral 05/19/2023   Barrett's esophagus 05/19/2023   Benign prostatic hyperplasia 05/19/2023   Benign prostatic hyperplasia without lower urinary tract symptoms 05/19/2023   Bilateral primary osteoarthritis of hip 05/19/2023   Low back pain, unspecified 05/19/2023   Encounter for other preprocedural examination 05/19/2023   Colonic polyp 05/19/2023   Essential (primary) hypertension 05/19/2023   Encounter for screening for malignant neoplasm of colon 05/19/2023   Insomnia 05/19/2023   Localized edema 05/19/2023    Multiple fractures of ribs, left side, sequela 05/19/2023   History of hepatitis C 05/19/2023   Radiculopathy, lumbar region 05/19/2023   Sensorineural hearing loss 05/19/2023   Shortness of breath 05/19/2023   Tinnitus 05/19/2023   Unspecified thoracic, thoracolumbar and lumbosacral intervertebral disc disorder 05/19/2023   Chronic left hip pain 05/28/2018   Spondylosis without myelopathy or radiculopathy, lumbar region 05/28/2018   Chronic bilateral low back pain without sciatica 05/10/2018    History reviewed. No pertinent surgical history.     Home Medications    Prior to Admission medications   Medication Sig Start Date End Date Taking? Authorizing Provider  albuterol (VENTOLIN HFA) 108 (90 Base) MCG/ACT inhaler Inhale 2 puffs into the lungs every 4 (four) hours as needed for wheezing or shortness of breath. 03/12/23  Yes [provider]  amoxicillin-clavulanate (AUGMENTIN) 875-125 MG tablet Take 1 tablet by mouth every 12 (twelve) hours for 10 days. 05/19/23 05/29/23 Yes Bing Neighbors, NP  atorvastatin (LIPITOR) 10 MG tablet Take 10 mg by mouth daily. 09/29/22  Yes [provider]  DULoxetine (CYMBALTA) 60 MG capsule Take 60 mg by mouth daily. 11/03/22  Yes [provider]  ezetimibe (ZETIA) 10 MG tablet Take 1 tablet by mouth daily. 03/24/18  Yes [provider]  fluticasone (FLONASE) 50 MCG/ACT nasal spray Place 2 sprays into both nostrils daily. 07/31/22  Yes [provider]  fluticasone (FLONASE) 50 MCG/ACT nasal spray Place 2 sprays into both nostrils daily. 01/26/23  Yes [provider]  gabapentin (NEURONTIN) 300 MG capsule Take  300 mg by mouth 3 (three) times daily. 06/29/18  Yes [provider]  gabapentin (NEURONTIN) 800 MG tablet Take 800 mg by mouth 2 (two) times daily. 09/29/22  Yes [provider]  ibuprofen (ADVIL) 200 MG tablet Take 200 mg by mouth every 6 (six) hours as needed. 04/14/18  Yes  [provider]  lisinopril (ZESTRIL) 10 MG tablet Take 10 mg by mouth daily. 03/23/18  Yes [provider]  lisinopril (ZESTRIL) 20 MG tablet Take 20 mg by mouth daily. 09/29/22  Yes [provider]  pantoprazole (PROTONIX) 40 MG tablet Take 1 tablet by mouth daily. 01/09/18  Yes [provider]  predniSONE (DELTASONE) 20 MG tablet Take 1 tablet (20 mg total) by mouth 2 (two) times daily with a meal. 05/19/23  Yes Bing Neighbors, NP  pregabalin (LYRICA) 150 MG capsule Take 150 mg by mouth 3 (three) times daily. 04/20/23  Yes [provider]  promethazine-dextromethorphan (PROMETHAZINE-DM) 6.25-15 MG/5ML syrup Take 5 mLs by mouth 3 (three) times daily as needed for cough. 05/19/23  Yes Bing Neighbors, NP  tamsulosin (FLOMAX) 0.4 MG CAPS capsule Take 0.4 mg by mouth as directed. 09/29/22  Yes [provider]  traMADol (ULTRAM) 50 MG tablet Take 50 mg by mouth 4 (four) times daily. 05/28/18  Yes [provider]  traZODone (DESYREL) 150 MG tablet Take 150 mg by mouth 2 (two) times daily. 09/29/22  Yes [provider]  traZODone (DESYREL) 150 MG tablet Take 150 mg by mouth at bedtime. 09/29/22  Yes [provider]  oxyCODONE-acetaminophen (PERCOCET) 5-325 MG tablet Take 2 tablets by mouth every 4 (four) hours as needed. 12/19/22   Mesner, Barbara Cower, MD    Family History History reviewed. No pertinent family history.  Social History Social History   Tobacco Use   Smoking status: Never   Smokeless tobacco: Never  Substance Use Topics   Drug use: Yes    Types: Marijuana     Allergies   Codeine   Review of Systems Review of Systems  HENT: Negative.    Respiratory:  Positive for cough, chest tightness (right lower rib pain), shortness of breath and wheezing.   Cardiovascular: Negative.   Musculoskeletal:  Positive for gait problem (s/p left hip replacement) and myalgias.     Physical Exam Triage Vital  Signs ED Triage Vitals  Encounter Vitals Group     BP 05/19/23 1208 123/67     Systolic BP Percentile --      Diastolic BP Percentile --      Pulse Rate 05/19/23 1208 68     Resp 05/19/23 1208 20     Temp 05/19/23 1208 98.4 F (36.9 C)     Temp Source 05/19/23 1208 Oral     SpO2 05/19/23 1208 90 %     Weight 05/19/23 1209 235 lb (106.6 kg)     Height --      Head Circumference --      Peak Flow --      Pain Score 05/19/23 1208 5     Pain Loc --      Pain Education --      Exclude from Growth Chart --    No data found.  Updated Vital Signs BP 123/67 (BP Location: Left Arm)   Pulse 68   Temp 98.4 F (36.9 C) (Oral)   Resp 20   Wt 235 lb (106.6 kg)   SpO2 90%   BMI 32.78 kg/m   Visual  Acuity Right Eye Distance:   Left Eye Distance:   Bilateral Distance:    Right Eye Near:   Left Eye Near:    Bilateral Near:     Physical Exam Vitals reviewed.  Neurological:     Mental Status: He is alert.      UC Treatments / Results  Labs (all labs ordered are listed, but only abnormal results are displayed) Labs Reviewed - No data to display  EKG   Radiology DG Chest 2 View Result Date: 05/19/2023 CLINICAL DATA:  Cough for several weeks EXAM: CHEST - 2 VIEW COMPARISON:  X-ray 12/18/2022 FINDINGS: Slight linear opacity left lung base likely scar or atelectasis. No consolidation, pneumothorax or effusion. No edema. Normal cardiopericardial silhouette. Calcified aorta. Calcified lungs in the right hilum. Please correlate for old granulomatous disease. Presumed nipple shadows along both lower lungs. Degenerative changes seen along the spine. IMPRESSION: Left basilar scar atelectasis. Evidence of old granulomatous disease. Presumed nipple shadows bilaterally. Follow up x-ray with nipple markers could be performed when clinically appropriate Electronically Signed   By: Karen Kays M.D.   On: 05/19/2023 14:41    Procedures Procedures (including critical care  time)  Medications Ordered in UC Medications  albuterol (VENTOLIN HFA) 108 (90 Base) MCG/ACT inhaler 2 puff (2 puffs Inhalation Given 05/19/23 1304)    Initial Impression / Assessment and Plan / UC Course  I have reviewed the triage vital signs and the nursing notes.  Pertinent labs & imaging results that were available during my care of the patient were reviewed by me and considered in my medical decision making (see chart for details).      Negative for rib fracture  Final Clinical Impressions(s) / UC Diagnoses   Final diagnoses:  Traumatic injury of rib  COPD exacerbation [facility])     Discharge Instructions      Review you chest x-ray and did not see a rib fracture. X-ray findings consistent with chronic bronchitis. I am treating you for COPD exacerbation. Start prednisone 20 mg twice daily for 5 days. Augmentin twice daily for 10 days. Promethazine DM take up to 3 times daily for cough.  If you experience any worsening of chest tightness or shortness of breath that is not improve with albuterol or medications prescribed, go to the nearest emergency department.    ED Prescriptions     Medication Sig Dispense Auth. Provider   amoxicillin-clavulanate (AUGMENTIN) 875-125 MG tablet Take 1 tablet by mouth every 12 (twelve) hours for 10 days. 20 tablet Bing Neighbors, NP   predniSONE (DELTASONE) 20 MG tablet Take 1 tablet (20 mg total) by mouth 2 (two) times daily with a meal. 10 tablet Bing Neighbors, NP   promethazine-dextromethorphan (PROMETHAZINE-DM) 6.25-15 MG/5ML syrup Take 5 mLs by mouth 3 (three) times daily as needed for cough. 180 mL Bing Neighbors, NP      PDMP not reviewed this encounter.

## 2023-05-19 NOTE — Discharge Instructions (Signed)
Review you chest x-ray and did not see a rib fracture. X-ray findings consistent with chronic bronchitis. I am treating you for COPD exacerbation. Start prednisone 20 mg twice daily for 5 days. Augmentin twice daily for 10 days. Promethazine DM take up to 3 times daily for cough.  If you experience any worsening of chest tightness or shortness of breath that is not improve with albuterol or medications prescribed, go to the nearest emergency department.

## 2023-05-19 NOTE — ED Triage Notes (Signed)
Patient presents for eval of cough x "weeks" and rib injury s/p fall.  States fall on Sunday. States tripped over a stone. Having right rib pain. Severe pain with cough. Cough is productive. Patient reports only smokes weed. No cigarettes. Shortness of breath for weeks.

## 2023-06-05 ENCOUNTER — Ambulatory Visit (HOSPITAL_BASED_OUTPATIENT_CLINIC_OR_DEPARTMENT_OTHER)
Admission: EM | Admit: 2023-06-05 | Discharge: 2023-06-05 | Disposition: A | Discharge status: Home / Self Care | Payer: No Typology Code available for payment source | Attending: Family Medicine | Admitting: Family Medicine

## 2023-06-05 ENCOUNTER — Ambulatory Visit (INDEPENDENT_AMBULATORY_CARE_PROVIDER_SITE_OTHER)
Admit: 2023-06-05 | Discharge: 2023-06-05 | Disposition: A | Discharge status: Home / Self Care | Payer: No Typology Code available for payment source | Attending: Family Medicine | Admitting: Family Medicine

## 2023-06-05 ENCOUNTER — Encounter (HOSPITAL_BASED_OUTPATIENT_CLINIC_OR_DEPARTMENT_OTHER): Payer: Self-pay

## 2023-06-05 DIAGNOSIS — R0602 Shortness of breath: Secondary | ICD-10-CM

## 2023-06-05 DIAGNOSIS — J441 Chronic obstructive pulmonary disease with (acute) exacerbation: Secondary | ICD-10-CM

## 2023-06-05 MED ORDER — HYDROCODONE BIT-HOMATROP MBR 5-1.5 MG/5ML PO SOLN: 5.0000 mL | Freq: Four times a day (QID) | ORAL | 0 refills | Status: AC | PRN

## 2023-06-05 MED ORDER — IPRATROPIUM-ALBUTEROL 0.5-2.5 (3) MG/3ML IN SOLN
3.0000 mL | Freq: Once | RESPIRATORY_TRACT | Status: AC
  Administered 2023-06-05: 3 mL via RESPIRATORY_TRACT

## 2023-06-05 MED ORDER — METHYLPREDNISOLONE 4 MG PO TBPK: ORAL_TABLET | ORAL | 0 refills | Status: AC

## 2023-06-05 MED ORDER — DOXYCYCLINE HYCLATE 100 MG PO CAPS: 100.0000 mg | ORAL_CAPSULE | Freq: Two times a day (BID) | ORAL | 0 refills | Status: AC

## 2023-06-05 MED ORDER — DEXAMETHASONE SODIUM PHOSPHATE 10 MG/ML IJ SOLN
10.0000 mg | Freq: Once | INTRAMUSCULAR | Status: AC
  Administered 2023-06-05: 10 mg via INTRAMUSCULAR

## 2023-06-05 NOTE — ED Triage Notes (Signed)
 Patient seen on 2/11 for COPD exacerbation. Patient states has not improved at all since last visit. Now running fever and feeling very weak. O2 sat on arrival to treatment room. Denies smoking cigarettes anymore.

## 2023-06-05 NOTE — ED Provider Notes (Signed)
 [facility]   [id number] 06/05/23 Arrival Time: 4098  ASSESSMENT & PLAN:  1. SOB (shortness of breath)   2. COPD exacerbation (HCC)    Symptoms have improved with duoneb x 2 and IM decadron. SpO2 around 92-93% on RA. Discussed option of ED evaluation. He is comfortable with beginning medications below and 24-48 hours observation.  Meds ordered this encounter  Medications   ipratropium-albuterol (DUONEB) 0.5-2.5 (3) MG/3ML nebulizer solution 3 mL   dexamethasone (DECADRON) injection 10 mg   doxycycline (VIBRAMYCIN) 100 MG capsule    Sig: Take 1 capsule (100 mg total) by mouth 2 (two) times daily.    Dispense:  20 capsule    Refill:  0   methylPREDNISolone (MEDROL DOSEPAK) 4 MG TBPK tablet    Sig: Take as directed.    Dispense:  1 each    Refill:  0   HYDROcodone bit-homatropine (HYCODAN) 5-1.5 MG/5ML syrup    Sig: Take 5 mLs by mouth every 6 (six) hours as needed for cough.    Dispense:  90 mL    Refill:  0   ipratropium-albuterol (DUONEB) 0.5-2.5 (3) MG/3ML nebulizer solution 3 mL   I have personally viewed and independently interpreted the imaging studies ordered this visit. CXR: bilateral haziness in lower lung fields; ques atelectasis; ques slightly increased on LLL vs previous CXR  Recommend:  Follow-up Information     [facility].   Specialty: Emergency Medicine Why: If symptoms worsen in any way. Contact information: [address] [place] [address] [phone]                Reviewed expectations re: course of current medical issues. Questions answered. Outlined signs and symptoms indicating need for more acute intervention. Patient verbalized understanding. After Visit Summary given.  SUBJECTIVE: History from: patient.  Carl Daniels is a 68 y.o. male with COPD; seen here on 05/19/23. Reports mild improvement after last visit with return of symptoms over past few days;  feeling fatigued; worsening productive cough. Ques subj fever yesterday evening. Denies n/v. Is ambulatory but feels SOB with exertion. Using inhaler with minimal relief. Denies CP. Quit smoking 18 y ago.   OBJECTIVE:  Vitals:   06/05/23 1026 06/05/23 1049 06/05/23 1114 06/05/23 1129  BP: (!) 152/91     Pulse: (!) 102  97   Resp:   (!) 24   Temp: 99.9 F (37.7 C)     TempSrc: Oral     SpO2: (!) 88% 95% 96% 91%    General appearance: alert; mild initial resp distress HEENT: [place]; AT; without nasal congestion Neck: supple without LAD Cv: RRR without murmer Lungs: unlabored respirations, moderate bilateral expiratory wheezing; significant coughing Skin: warm and dry Psychological: alert and cooperative; normal mood and affect  Imaging: DG Chest 2 View Result Date: 06/05/2023 CLINICAL DATA:  COPD. EXAM: CHEST - 2 VIEW COMPARISON:  Chest radiograph 05/19/2023 FINDINGS: Stable cardiac and mediastinal contours. Calcified right hilar lymph node. Low lung volumes. Minimal heterogeneous opacities lung bases bilaterally. No pleural effusion or pneumothorax. Midthoracic spine degenerative changes. IMPRESSION: Low lung volumes with bibasilar atelectasis. Electronically Signed   By: Annia Belt M.D.   On: 06/05/2023 12:08   DG Chest 2 View Result Date: 05/19/2023 CLINICAL DATA:  Cough for several weeks EXAM: CHEST - 2 VIEW COMPARISON:  X-ray 12/18/2022 FINDINGS: Slight linear opacity left lung base likely scar or atelectasis. No consolidation, pneumothorax or effusion. No edema. Normal cardiopericardial silhouette.  Calcified aorta. Calcified lungs in the right hilum. Please correlate for old granulomatous disease. Presumed nipple shadows along both lower lungs. Degenerative changes seen along the spine. IMPRESSION: Left basilar scar atelectasis. Evidence of old granulomatous disease. Presumed nipple shadows bilaterally. Follow up x-ray with nipple markers could be performed when clinically appropriate  Electronically Signed   By: Karen Kays M.D.   On: 05/19/2023 14:41      Allergies  Allergen Reactions   Codeine Nausea And Vomiting    Other Reaction(s): GI Intolerance    History reviewed. No pertinent past medical history. History reviewed. No pertinent family history. Social History   Socioeconomic History   Marital status: Single    Spouse name: Not on file   Number of children: Not on file   Years of education: Not on file   Highest education level: Not on file  Occupational History   Not on file  Tobacco Use   Smoking status: Never   Smokeless tobacco: Never  Substance and Sexual Activity   Alcohol use: Not on file   Drug use: Yes    Types: Marijuana   Sexual activity: Not on file  Other Topics Concern   Not on file  Social History Narrative   Not on file   Social Drivers of Health   Financial Resource Strain: Not on file  Food Insecurity: Not on file  Transportation Needs: Not on file  Physical Activity: Not on file  Stress: Not on file  Social Connections: Not on file  Intimate Partner Violence: Not on file             Mardella Layman, MD 06/05/23 1431

## 2023-06-05 NOTE — Discharge Instructions (Signed)
 Be aware, your cough medication may cause drowsiness. Please do not drive, operate heavy machinery or make important decisions while on this medication, it can cloud your judgement.

## 2023-06-05 NOTE — ED Notes (Signed)
 Assessed patient in lobby. Skin color good. O2 Sat 93% at rest. HR 102
